# Patient Record
Sex: Male | Born: 1935 | Race: White | Hispanic: No | Smoking: Former smoker
Health system: Southern US, Community
[De-identification: ages and names within clinical notes are randomized; demographics above are authoritative.]

## PROBLEM LIST (undated history)

## (undated) DIAGNOSIS — E119 Type 2 diabetes mellitus without complications: Secondary | ICD-10-CM

## (undated) DIAGNOSIS — G20 Parkinson's disease: Secondary | ICD-10-CM

## (undated) DIAGNOSIS — I10 Essential (primary) hypertension: Secondary | ICD-10-CM

## (undated) HISTORY — PX: CHOLECYSTECTOMY: SHX55

---

## 2004-04-12 ENCOUNTER — Inpatient Hospital Stay: Payer: Self-pay | Admitting: Internal Medicine

## 2005-09-11 ENCOUNTER — Emergency Department: Payer: Self-pay | Admitting: Emergency Medicine

## 2005-12-02 ENCOUNTER — Ambulatory Visit (HOSPITAL_COMMUNITY): Admission: RE | Admit: 2005-12-02 | Discharge: 2005-12-02 | Payer: Self-pay | Admitting: Unknown Physician Specialty

## 2009-03-29 ENCOUNTER — Inpatient Hospital Stay: Payer: Self-pay | Admitting: Internal Medicine

## 2010-04-03 ENCOUNTER — Observation Stay: Payer: Self-pay | Admitting: Internal Medicine

## 2011-12-25 ENCOUNTER — Emergency Department: Payer: Self-pay | Admitting: Emergency Medicine

## 2011-12-25 LAB — BASIC METABOLIC PANEL
Calcium, Total: 8.9 mg/dL (ref 8.5–10.1)
Chloride: 102 mmol/L (ref 98–107)
Co2: 28 mmol/L (ref 21–32)
EGFR (Non-African Amer.): 59 — ABNORMAL LOW
Potassium: 4.2 mmol/L (ref 3.5–5.1)

## 2011-12-25 LAB — CBC
HCT: 40 % (ref 40.0–52.0)
HGB: 13.4 g/dL (ref 13.0–18.0)
MCHC: 33.4 g/dL (ref 32.0–36.0)
WBC: 8.2 10*3/uL (ref 3.8–10.6)

## 2011-12-26 LAB — URINALYSIS, COMPLETE
Bacteria: NONE SEEN
Bilirubin,UR: NEGATIVE
Glucose,UR: 150 mg/dL (ref 0–75)
Ketone: NEGATIVE
Leukocyte Esterase: NEGATIVE
Ph: 5 (ref 4.5–8.0)
RBC,UR: 1 /HPF (ref 0–5)
Squamous Epithelial: NONE SEEN

## 2011-12-26 LAB — HEPATIC FUNCTION PANEL A (ARMC)
Bilirubin, Direct: 0.2 mg/dL (ref 0.00–0.20)
Bilirubin,Total: 0.9 mg/dL (ref 0.2–1.0)

## 2013-01-17 ENCOUNTER — Emergency Department: Payer: Self-pay | Admitting: Emergency Medicine

## 2013-01-17 LAB — CBC WITH DIFFERENTIAL/PLATELET
BASOS PCT: 0.8 %
Basophil #: 0.1 10*3/uL (ref 0.0–0.1)
EOS ABS: 0.1 10*3/uL (ref 0.0–0.7)
EOS PCT: 0.7 %
HCT: 40.5 % (ref 40.0–52.0)
HGB: 13.9 g/dL (ref 13.0–18.0)
LYMPHS ABS: 2.6 10*3/uL (ref 1.0–3.6)
Lymphocyte %: 26 %
MCH: 31.8 pg (ref 26.0–34.0)
MCHC: 34.3 g/dL (ref 32.0–36.0)
MCV: 93 fL (ref 80–100)
MONO ABS: 0.9 x10 3/mm (ref 0.2–1.0)
MONOS PCT: 8.9 %
NEUTROS ABS: 6.3 10*3/uL (ref 1.4–6.5)
Neutrophil %: 63.6 %
PLATELETS: 220 10*3/uL (ref 150–440)
RBC: 4.37 10*6/uL — ABNORMAL LOW (ref 4.40–5.90)
RDW: 13.1 % (ref 11.5–14.5)
WBC: 9.9 10*3/uL (ref 3.8–10.6)

## 2013-01-17 LAB — COMPREHENSIVE METABOLIC PANEL
ALK PHOS: 37 U/L — AB
AST: 18 U/L (ref 15–37)
Albumin: 3.6 g/dL (ref 3.4–5.0)
Anion Gap: 7 (ref 7–16)
BILIRUBIN TOTAL: 1.3 mg/dL — AB (ref 0.2–1.0)
BUN: 25 mg/dL — AB (ref 7–18)
CO2: 26 mmol/L (ref 21–32)
Calcium, Total: 9.5 mg/dL (ref 8.5–10.1)
Chloride: 101 mmol/L (ref 98–107)
Creatinine: 1.35 mg/dL — ABNORMAL HIGH (ref 0.60–1.30)
EGFR (African American): 57 — ABNORMAL LOW
GFR CALC NON AF AMER: 50 — AB
GLUCOSE: 224 mg/dL — AB (ref 65–99)
Osmolality: 280 (ref 275–301)
Potassium: 4.1 mmol/L (ref 3.5–5.1)
SGPT (ALT): 26 U/L (ref 12–78)
SODIUM: 134 mmol/L — AB (ref 136–145)
TOTAL PROTEIN: 6.9 g/dL (ref 6.4–8.2)

## 2013-01-17 LAB — MAGNESIUM: MAGNESIUM: 1.6 mg/dL — AB

## 2013-08-01 ENCOUNTER — Emergency Department: Payer: Self-pay | Admitting: Emergency Medicine

## 2013-08-01 LAB — BASIC METABOLIC PANEL
Anion Gap: 7 (ref 7–16)
BUN: 18 mg/dL (ref 7–18)
CHLORIDE: 102 mmol/L (ref 98–107)
CO2: 27 mmol/L (ref 21–32)
CREATININE: 1.29 mg/dL (ref 0.60–1.30)
Calcium, Total: 9 mg/dL (ref 8.5–10.1)
GFR CALC NON AF AMER: 52 — AB
Glucose: 117 mg/dL — ABNORMAL HIGH (ref 65–99)
OSMOLALITY: 275 (ref 275–301)
Potassium: 4.1 mmol/L (ref 3.5–5.1)
SODIUM: 136 mmol/L (ref 136–145)

## 2013-08-01 LAB — TSH: THYROID STIMULATING HORM: 0.987 u[IU]/mL

## 2013-08-01 LAB — HEPATIC FUNCTION PANEL A (ARMC)
ALBUMIN: 3.7 g/dL (ref 3.4–5.0)
ALT: 29 U/L (ref 12–78)
AST: 28 U/L (ref 15–37)
Alkaline Phosphatase: 36 U/L — ABNORMAL LOW
BILIRUBIN DIRECT: 0.2 mg/dL (ref 0.00–0.20)
Bilirubin,Total: 1.4 mg/dL — ABNORMAL HIGH (ref 0.2–1.0)
Total Protein: 7.5 g/dL (ref 6.4–8.2)

## 2013-08-01 LAB — CBC
HCT: 42.1 % (ref 40.0–52.0)
HGB: 14.3 g/dL (ref 13.0–18.0)
MCH: 31.8 pg (ref 26.0–34.0)
MCHC: 33.9 g/dL (ref 32.0–36.0)
MCV: 94 fL (ref 80–100)
PLATELETS: 222 10*3/uL (ref 150–440)
RBC: 4.48 10*6/uL (ref 4.40–5.90)
RDW: 12.8 % (ref 11.5–14.5)
WBC: 9 10*3/uL (ref 3.8–10.6)

## 2013-08-01 LAB — LIPASE, BLOOD: Lipase: 108 U/L (ref 73–393)

## 2013-08-01 LAB — TROPONIN I

## 2013-08-01 LAB — T4, FREE: Free Thyroxine: 0.94 ng/dL (ref 0.76–1.46)

## 2016-02-15 ENCOUNTER — Emergency Department: Payer: Medicare Other

## 2016-02-15 ENCOUNTER — Encounter: Payer: Self-pay | Admitting: Emergency Medicine

## 2016-02-15 ENCOUNTER — Emergency Department
Admission: EM | Admit: 2016-02-15 | Discharge: 2016-02-15 | Disposition: A | Discharge status: Home / Self Care | Payer: Medicare Other | Attending: Emergency Medicine | Admitting: Emergency Medicine

## 2016-02-15 DIAGNOSIS — Z87891 Personal history of nicotine dependence: Secondary | ICD-10-CM | POA: Insufficient documentation

## 2016-02-15 DIAGNOSIS — K2900 Acute gastritis without bleeding: Secondary | ICD-10-CM

## 2016-02-15 DIAGNOSIS — I10 Essential (primary) hypertension: Secondary | ICD-10-CM | POA: Diagnosis not present

## 2016-02-15 DIAGNOSIS — E119 Type 2 diabetes mellitus without complications: Secondary | ICD-10-CM | POA: Insufficient documentation

## 2016-02-15 DIAGNOSIS — R1013 Epigastric pain: Secondary | ICD-10-CM | POA: Diagnosis present

## 2016-02-15 HISTORY — DX: Type 2 diabetes mellitus without complications: E11.9

## 2016-02-15 HISTORY — DX: Essential (primary) hypertension: I10

## 2016-02-15 LAB — BASIC METABOLIC PANEL
Anion gap: 7 (ref 5–15)
BUN: 22 mg/dL — AB (ref 6–20)
CHLORIDE: 99 mmol/L — AB (ref 101–111)
CO2: 26 mmol/L (ref 22–32)
Calcium: 9.3 mg/dL (ref 8.9–10.3)
Creatinine, Ser: 1.23 mg/dL (ref 0.61–1.24)
GFR, EST NON AFRICAN AMERICAN: 52 mL/min — AB (ref >60)
Glucose, Bld: 111 mg/dL — ABNORMAL HIGH (ref 65–99)
POTASSIUM: 3.7 mmol/L (ref 3.5–5.1)
SODIUM: 132 mmol/L — AB (ref 135–145)

## 2016-02-15 LAB — CBC
HEMATOCRIT: 38.9 % — AB (ref 40.0–52.0)
Hemoglobin: 13.5 g/dL (ref 13.0–18.0)
MCH: 32.5 pg (ref 26.0–34.0)
MCHC: 34.7 g/dL (ref 32.0–36.0)
MCV: 93.7 fL (ref 80.0–100.0)
PLATELETS: 235 10*3/uL (ref 150–440)
RBC: 4.15 MIL/uL — AB (ref 4.40–5.90)
RDW: 13.3 % (ref 11.5–14.5)
WBC: 9.6 10*3/uL (ref 3.8–10.6)

## 2016-02-15 LAB — INFLUENZA PANEL BY PCR (TYPE A & B)
INFLAPCR: NEGATIVE
INFLBPCR: NEGATIVE

## 2016-02-15 LAB — TROPONIN I: Troponin I: <0.03 ng/mL (ref <0.03)

## 2016-02-15 MED ORDER — ONDANSETRON 4 MG PO TBDP
4.0000 mg | ORAL_TABLET | Freq: Once | ORAL | Status: AC | PRN
  Administered 2016-02-15: 4 mg via ORAL
  Filled 2016-02-15: qty 1

## 2016-02-15 MED ORDER — ONDANSETRON HCL 4 MG/2ML IJ SOLN
4.0000 mg | Freq: Once | INTRAMUSCULAR | Status: AC
  Administered 2016-02-15: 4 mg via INTRAVENOUS
  Filled 2016-02-15: qty 2

## 2016-02-15 MED ORDER — ONDANSETRON 4 MG PO TBDP: 4.0000 mg | ORAL_TABLET | Freq: Three times a day (TID) | ORAL | 0 refills | Status: AC | PRN

## 2016-02-15 MED ORDER — SODIUM CHLORIDE 0.9 % IV BOLUS (SEPSIS)
500.0000 mL | Freq: Once | INTRAVENOUS | Status: AC
  Administered 2016-02-15: 500 mL via INTRAVENOUS

## 2016-02-15 NOTE — ED Provider Notes (Signed)
[facility] Emergency Department Provider Note   ____________________________________________    I have reviewed the triage vital signs and the nursing notes.   HISTORY  Chief Complaint Chest Pain     HPI Kirk Obrien is a 83 y.o. male who presents with complaints of nausea and abdominal cramping especially in the epigastrium. He felt like his chest was bothering him at one point now but no longer. He reports he felt well this morning but went to eat breakfast on the way back he started to feel "sick". He states he developed significant nausea from then on, he denies abdominal pain at this time but feels very nauseous. He also feels body aches. No sick contacts reported.   Past Medical History:  Diagnosis Date  . Diabetes mellitus without complication (HCC)   . Hypertension     There are no active problems to display for this patient.   History reviewed. No pertinent surgical history.  Prior to Admission medications   Medication Sig Start Date End Date Taking? Authorizing Provider  ondansetron (ZOFRAN ODT) 4 MG disintegrating tablet Take 1 tablet (4 mg total) by mouth every 8 (eight) hours as needed for nausea or vomiting. 02/15/16   Jene Every, MD     Allergies Patient has no known allergies.  No family history on file.  Social History Social History  Substance Use Topics  . Smoking status: Former Games developer  . Smokeless tobacco: Not on file  . Alcohol use Not on file    Review of Systems  Constitutional: No fever/chills Eyes: No visual changes.  ENT: No sore throat. Cardiovascular: As above Respiratory: Denies shortness of breath. Gastrointestinal: Nausea as above Genitourinary: Negative for dysuria. Musculoskeletal: Negative for back pain. Skin: Negative for rash. Neurological: Negative for headaches   10-point ROS otherwise negative.  ____________________________________________   PHYSICAL EXAM:  VITAL  SIGNS: ED Triage Vitals [02/15/16 0958]  Enc Vitals Group     BP (!) 151/59     Pulse Rate (!) 59     Resp 20     Temp 97.9 F (36.6 C)     Temp Source Oral     SpO2 98 %     Weight 194 lb (88 kg)     Height 6\' 1"  (1.854 m)     Head Circumference      Peak Flow      Pain Score      Pain Loc      Pain Edu?      Excl. in GC?     Constitutional: Alert and oriented. No acute distress. Pleasant and interactive Eyes: Conjunctivae are normal.   Nose: No congestion/rhinnorhea. Mouth/Throat: Mucous membranes are moist.    Cardiovascular: Normal rate, regular rhythm. Grossly normal heart sounds.  Good peripheral circulation. Respiratory: Normal respiratory effort.  No retractions. Lungs CTAB. Gastrointestinal: Soft and nontender. No distention.  No CVA tenderness. Genitourinary: deferred Musculoskeletal: No lower extremity tenderness nor edema.  Warm and well perfused Neurologic:  Normal speech and language. No gross focal neurologic deficits are appreciated.  Skin:  Skin is warm, dry and intact. No rash noted. Psychiatric: Mood and affect are normal. Speech and behavior are normal.  ____________________________________________   LABS (all labs ordered are listed, but only abnormal results are displayed)  Labs Reviewed  BASIC METABOLIC PANEL - Abnormal; Notable for the following:       Result Value   Sodium 132 (*)    Chloride 99 (*)  Glucose, Bld 111 (*)    BUN 22 (*)    GFR calc non Af Amer 52 (*)    All other components within normal limits  CBC - Abnormal; Notable for the following:    RBC 4.15 (*)    HCT 38.9 (*)    All other components within normal limits  TROPONIN I  TROPONIN I  INFLUENZA PANEL BY PCR (TYPE A & B)   ____________________________________________  EKG  ED ECG REPORT I, Jene EveryKINNER, Kimball, the attending physician, personally viewed and interpreted this ECG.  Date: 02/15/2016 EKG Time: 9:57 AM Rate: 59 Rhythm: Sinus bradycardia QRS Axis:  normal Intervals: normal ST/T Wave abnormalities: normal Conduction Disturbances: none Narrative Interpretation: unremarkable  ____________________________________________  RADIOLOGY  Chest x-ray unremarkable ____________________________________________   PROCEDURES  Procedure(s) performed: No    Critical Care performed: No ____________________________________________   INITIAL IMPRESSION / ASSESSMENT AND PLAN / ED COURSE  Pertinent labs & imaging results that were available during my care of the patient were reviewed by me and considered in my medical decision making (see chart for details).  Patient presented with complaints of primarily significant nausea. He also notes body aches. Denies chest pain to me at this time. No fevers or chills reported. Overall symptoms are somewhat vague. We will give IV fluids check an influenza and reevaluate    _----------------------------------------- 3:06 PM on 02/15/2016 -----------------------------------------  Patient feels significantly better after nausea medication. Second troponin is normal. Influenza is negative. He is sitting on the edge of the bed and anxious to go home. I feel this is reasonable. This may be related to gastritis secondary to his breakfast. He agrees with discharge and will return if any worsening of his symptoms. ___________________________________________   FINAL CLINICAL IMPRESSION(S) / ED DIAGNOSES  Final diagnoses:  Acute gastritis without hemorrhage, unspecified gastritis type      NEW MEDICATIONS STARTED DURING THIS VISIT:  New Prescriptions   ONDANSETRON (ZOFRAN ODT) 4 MG DISINTEGRATING TABLET    Take 1 tablet (4 mg total) by mouth every 8 (eight) hours as needed for nausea or vomiting.     Note:  This document was prepared using Dragon voice recognition software and may include unintentional dictation errors.    Jene Everyobert Keilan Nichol, MD 02/15/16 [phone]

## 2016-02-15 NOTE — ED Notes (Signed)
Pt states he is feeling better since last dose of zofran. Will continue to monitor the pt.

## 2016-02-15 NOTE — ED Triage Notes (Signed)
States chest pain, SOB, nausea and dizziness began 30 min prior to arrival.

## 2017-09-30 ENCOUNTER — Emergency Department: Payer: Medicare Other

## 2017-09-30 ENCOUNTER — Other Ambulatory Visit: Payer: Self-pay

## 2017-09-30 ENCOUNTER — Emergency Department
Admission: EM | Admit: 2017-09-30 | Discharge: 2017-09-30 | Disposition: A | Discharge status: Home / Self Care | Payer: Medicare Other | Attending: Emergency Medicine | Admitting: Emergency Medicine

## 2017-09-30 DIAGNOSIS — Z87891 Personal history of nicotine dependence: Secondary | ICD-10-CM | POA: Diagnosis not present

## 2017-09-30 DIAGNOSIS — R0602 Shortness of breath: Secondary | ICD-10-CM | POA: Insufficient documentation

## 2017-09-30 DIAGNOSIS — E119 Type 2 diabetes mellitus without complications: Secondary | ICD-10-CM | POA: Diagnosis not present

## 2017-09-30 DIAGNOSIS — G20 Parkinson's disease: Secondary | ICD-10-CM | POA: Insufficient documentation

## 2017-09-30 DIAGNOSIS — K2970 Gastritis, unspecified, without bleeding: Secondary | ICD-10-CM | POA: Insufficient documentation

## 2017-09-30 DIAGNOSIS — I10 Essential (primary) hypertension: Secondary | ICD-10-CM | POA: Diagnosis not present

## 2017-09-30 HISTORY — DX: Parkinson's disease: G20

## 2017-09-30 LAB — BASIC METABOLIC PANEL
ANION GAP: 10 (ref 5–15)
BUN: 21 mg/dL (ref 8–23)
CALCIUM: 9 mg/dL (ref 8.9–10.3)
CO2: 29 mmol/L (ref 22–32)
Chloride: 99 mmol/L (ref 98–111)
Creatinine, Ser: 1.01 mg/dL (ref 0.61–1.24)
GLUCOSE: 187 mg/dL — AB (ref 70–99)
Potassium: 3.8 mmol/L (ref 3.5–5.1)
SODIUM: 138 mmol/L (ref 135–145)

## 2017-09-30 LAB — CBC
HCT: 36.5 % — ABNORMAL LOW (ref 40.0–52.0)
HEMOGLOBIN: 12.5 g/dL — AB (ref 13.0–18.0)
MCH: 32.1 pg (ref 26.0–34.0)
MCHC: 34.3 g/dL (ref 32.0–36.0)
MCV: 93.7 fL (ref 80.0–100.0)
PLATELETS: 208 10*3/uL (ref 150–440)
RBC: 3.89 MIL/uL — ABNORMAL LOW (ref 4.40–5.90)
RDW: 14.3 % (ref 11.5–14.5)
WBC: 6.3 10*3/uL (ref 3.8–10.6)

## 2017-09-30 LAB — TROPONIN I

## 2017-09-30 MED ORDER — GI COCKTAIL ~~LOC~~
30.0000 mL | Freq: Once | ORAL | Status: AC
  Administered 2017-09-30: 30 mL via ORAL
  Filled 2017-09-30: qty 30

## 2017-09-30 NOTE — ED Notes (Signed)
Pt sitting on edge of bed drinking water.  Continues to say he is choking.  Reassurance provided.

## 2017-09-30 NOTE — ED Notes (Signed)
Called to pt room.  Pt states he is choking.  Water given to pt.  He swallowed the water without difficulty (1/2 cup).  Continued to state he is choking.  Ask for MD.  MD consulted and pt instructed (as MD said) that the GI cocktail may have numbed his throat some and that is what he is feeling.  Pt sitting on edge of bed.  Appears anxious.  Reassurance provided.

## 2017-09-30 NOTE — ED Notes (Signed)
Pt sitting on bedside.  Appears anxious.  Reassurance provided.

## 2017-09-30 NOTE — ED Triage Notes (Signed)
Pt c/o sob that started approx 45 minutes ago while sitting down. Pt does not appear to be in any respiratory distress. Talks evenly, and without difficulty. Pt alert and oriented X4, active, cooperative, pt in NAD. RR even and unlabored, color WNL. '

## 2017-09-30 NOTE — ED Provider Notes (Signed)
[facility] Emergency Department Provider Note ___________________________________________   First MD Initiated Contact with Patient 09/30/17 1603     (approximate)  I have reviewed the triage vital signs and the nursing notes.   HISTORY  Chief Complaint Shortness of Breath  HPI Kirk Obrien is a 84 y.o. male a history of Parkinson's disease, diabetes hypertension anxiety was presented to the emergency department with shortness of breath that started approximately 2 PM.  The patient says that he woke from a nap feeling very short of breath and the sensation lasted about 45 minutes.  He took 1 baby aspirin now says that he has burning to his epigastrium which is mild and nonradiating.  However, denies any persistence of the shortness of breath.  Denies any chest pain.  Denies any nausea or vomiting.  Says that he has had this shortness of breath in the past associated with anxiety.  However, the burning in the upper abdomen is new.  Past Medical History:  Diagnosis Date  . Diabetes mellitus without complication (HCC)   . Hypertension   . Parkinson's disease (HCC)     There are no active problems to display for this patient.   History reviewed. No pertinent surgical history.  Prior to Admission medications   Medication Sig Start Date End Date Taking? Authorizing Provider  ondansetron (ZOFRAN ODT) 4 MG disintegrating tablet Take 1 tablet (4 mg total) by mouth every 8 (eight) hours as needed for nausea or vomiting. 02/15/16   Jene Every, MD    Allergies Patient has no known allergies.  No family history on file.  Social History Social History   Tobacco Use  . Smoking status: Former Smoker  Substance Use Topics  . Alcohol use: Not on file  . Drug use: Not on file    Review of Systems  Constitutional: No fever/chills Eyes: No visual changes. ENT: No sore throat. Cardiovascular: Denies chest pain. Respiratory: As  above Gastrointestinal: No nausea, no vomiting.  No diarrhea.  No constipation. Genitourinary: Negative for dysuria. Musculoskeletal: Negative for back pain. Skin: Negative for rash. Neurological: Negative for headaches, focal weakness or numbness.   ____________________________________________   PHYSICAL EXAM:  VITAL SIGNS: ED Triage Vitals [09/30/17 1442]  Enc Vitals Group     BP (!) 164/64     Pulse Rate (!) 56     Resp 16     Temp 97.7 F (36.5 C)     Temp Source Oral     SpO2 99 %     Weight 180 lb (81.6 kg)     Height 6\' 2"  (1.88 m)     Head Circumference      Peak Flow      Pain Score 0     Pain Loc      Pain Edu?      Excl. in GC?     Constitutional: Alert and oriented. Well appearing and in no acute distress. Eyes: Conjunctivae are normal.  Head: Atraumatic. Nose: No congestion/rhinnorhea. Mouth/Throat: Mucous membranes are moist.  Neck: No stridor.   Cardiovascular: Normal rate, regular rhythm. Grossly normal heart sounds.  Respiratory: Normal respiratory effort.  No retractions. Lungs CTAB. Gastrointestinal: Soft with very mild epigastric tenderness to palpation. No distention.  Musculoskeletal: Patient with moderate bilateral lower extremity edema which she says is unchanged from previous.  No joint effusions. Neurologic:  Normal speech and language. No gross focal neurologic deficits are appreciated. Skin:  Skin is warm, dry and intact. No rash  noted. Psychiatric: Mood and affect are normal. Speech and behavior are normal.  ____________________________________________   LABS (all labs ordered are listed, but only abnormal results are displayed)  Labs Reviewed  BASIC METABOLIC PANEL - Abnormal; Notable for the following components:      Result Value   Glucose, Bld 187 (*)    All other components within normal limits  CBC - Abnormal; Notable for the following components:   RBC 3.89 (*)    Hemoglobin 12.5 (*)    HCT 36.5 (*)    All other  components within normal limits  TROPONIN I  TROPONIN I   ____________________________________________  EKG  ED ECG REPORT I, Arelia Longestavid M Trenity Pha, the attending physician, personally viewed and interpreted this ECG.   Date: 09/30/2017  EKG Time: 1438  Rate: 58  Rhythm: normal sinus rhythm  Axis: Normal  Intervals:none  ST&T Change: No ST segment elevation or depression.  No abnormal T wave inversion.  ____________________________________________  RADIOLOGY  Chest x-ray without any acute pathology. ____________________________________________   PROCEDURES  Procedure(s) performed:   Procedures  Critical Care performed:   ____________________________________________   INITIAL IMPRESSION / ASSESSMENT AND PLAN / ED COURSE  Pertinent labs & imaging results that were available during my care of the patient were reviewed by me and considered in my medical decision making (see chart for details).  Differential includes, but is not limited to, viral syndrome, bronchitis including COPD exacerbation, pneumonia, reactive airway disease including asthma, CHF including exacerbation with or without pulmonary/interstitial edema, pneumothorax, ACS, thoracic trauma, and pulmonary embolism. Differential diagnosis includes, but is not limited to, biliary disease (biliary colic, acute cholecystitis, cholangitis, choledocholithiasis, etc), intrathoracic causes for epigastric abdominal pain including ACS, gastritis, duodenitis, pancreatitis, small bowel or large bowel obstruction, abdominal aortic aneurysm, hernia, and ulcer(s). As part of my medical decision making, I reviewed the following data within the electronic MEDICAL RECORD NUMBER Notes from prior ED visits  ----------------------------------------- 6:32 PM on 09/30/2017 -----------------------------------------  Patient at this time is symptom-free after GI cocktail.  Initially he thought that he was choking but this was just the GI  cocktail numbing his throat.  He now has no abdominal pain.  Also seems to have a history of gastritis from previous ER records.  Second troponin negative.  Will be discharged at this time.  To follow-up as an outpatient.  He is understanding the diagnosis as well as treatment plan willing to comply. ____________________________________________   FINAL CLINICAL IMPRESSION(S) / ED DIAGNOSES  Shortness of breath.  Gastritis.  NEW MEDICATIONS STARTED DURING THIS VISIT:  New Prescriptions   No medications on file     Note:  This document was prepared using Dragon voice recognition software and may include unintentional dictation errors.     Myrna BlazerSchaevitz, Sugey Trevathan Matthew, MD 09/30/17 [id number]

## 2017-11-06 ENCOUNTER — Ambulatory Visit: Payer: Medicare Other | Admitting: Cardiology

## 2017-11-13 NOTE — Progress Notes (Signed)
Cardiology Office Note:    Date:  11/14/2017   ID:  Kirk Obrien, DOB 1931-11-13, MRN [id number]  PCP:  Sharalyn Ink, MD  Cardiologist:  Jodelle Red, MD PhD  Referring MD: Sharalyn Ink, MD   CC: shortness of breath, LE edema.   History of Present Illness:    Kirk Obrien is a 84 y.o. male with a hx of hypertension, h pylori s/p treatment, type II diabetes who is seen as a new consult at the request of Rutherford, Tobi Bastos, MD for the evaluation and management of shortness of breath and LE edema.  Patient (and son) concerns today: no urgent concerns today, feeling well overall. Had shortness of breath at [facility] with leg edema. No prior history of heart issues.   He reports that he had swelling for 2-3 weeks but had shortness of breath for 3-4 hours. It woke him up from a nap. Went to [facility] for evaluation. He denies chest pain to me. Had one prior episode of SOB when he went to [facility] ER about 3 weeks prior to [name] visit. At the ER on 09/30/17, he was given lasix (not clear in the chart of the dose), initially taken every day and now just takes if he gains 3 lbs.   Sees Dr. Eustace Quail at the [facility]. He saw her yesterday, was told he doesn't have Parkinson's, but has some unknown reason for shuffling gait.   Several years ago was told he had a leaky valve, was checked again and told it was fine. Had those echoes several years ago at the [place]. Has not had any echoes showing failure, but that is the concern now.  When he was the most swollen, weight was in the 170s but very swollen. Now he is trying to gain weight, up to 181, with ensure.  Weighs himself and takes blood pressures daily. Now takes furosemide about 1/week, though in the last three days he has taken lasix twice for cough/shortness of breath.   Does physical therapy twice a week, no chest pain or shortness of breath with that. No longer lies flat, sleeps in recliner (reclines nearly flat). No PND. LE  edema much improved. Has had 7 mechanical falls (last >1 month ago), no syncope or palpitations.   Past Medical History:  Diagnosis Date  . Diabetes mellitus without complication (HCC)   . Hypertension   . Parkinson's disease (HCC)     History reviewed. No pertinent surgical history.  Current Medications: Current Outpatient Medications on File Prior to Visit  Medication Sig  . chlorthalidone (HYGROTON) 50 MG tablet Take by mouth.  . coal tar-salicylic acid 2 % shampoo Apply topically.  . docusate sodium (COLACE) 100 MG capsule Take by mouth.  . fluocinolone (SYNALAR) 0.01 % external solution Apply topically.  . fluoruracil (CARAC) 0.5 % cream Apply topically.  . fluticasone (FLONASE) 50 MCG/ACT nasal spray Place into the nose.  Marland Kitchen glipiZIDE (GLUCOTROL) 5 MG tablet Take by mouth.  . hydrALAZINE (APRESOLINE) 25 MG tablet Take by mouth.  Marland Kitchen lisinopril (PRINIVIL,ZESTRIL) 40 MG tablet Take by mouth.  . metFORMIN (GLUCOPHAGE) 500 MG tablet Take by mouth.  . metoprolol succinate (TOPROL-XL) 50 MG 24 hr tablet Take by mouth.  Marland Kitchen omeprazole (PRILOSEC) 20 MG capsule Take by mouth.  . ondansetron (ZOFRAN ODT) 4 MG disintegrating tablet Take 1 tablet (4 mg total) by mouth every 8 (eight) hours as needed for nausea or vomiting.  Marland Kitchen rOPINIRole (REQUIP) 1 MG tablet Take by mouth.  Marland Kitchen  temazepam (RESTORIL) 15 MG capsule Take by mouth.  . traZODone (DESYREL) 100 MG tablet Take by mouth.   No current facility-administered medications on file prior to visit.      Allergies:   Patient has no known allergies.   Social History   Socioeconomic History  . Marital status: Divorced    Spouse name: Not on file  . Number of children: Not on file  . Years of education: Not on file  . Highest education level: Not on file  Occupational History  . Not on file  Social Needs  . Financial resource strain: Not on file  . Food insecurity:    Worry: Not on file    Inability: Not on file  . Transportation  needs:    Medical: Not on file    Non-medical: Not on file  Tobacco Use  . Smoking status: Former Games developer  . Smokeless tobacco: Never Used  Substance and Sexual Activity  . Alcohol use: Not on file  . Drug use: Not on file  . Sexual activity: Not on file  Lifestyle  . Physical activity:    Days per week: Not on file    Minutes per session: Not on file  . Stress: Not on file  Relationships  . Social connections:    Talks on phone: Not on file    Gets together: Not on file    Attends religious service: Not on file    Active member of club or organization: Not on file    Attends meetings of clubs or organizations: Not on file    Relationship status: Not on file  Other Topics Concern  . Not on file  Social History Narrative  . Not on file     Family History: The patient's family history is notable for uncles (maternal side) with multiple Mis. Father's side was alcoholism. One brother (late 51s) and one sister (in her 32s) died of cancer.  ROS:   Please see the history of present illness.  Additional pertinent ROS:  Constitutional: Negative for chills, fever, night sweats, unintentional weight loss  HENT: Negative for ear pain. Positive for chronic hearing loss.   Eyes: Negative for loss of vision and eye pain.  Respiratory: Positive for resolved shortness of breath. Negative for cough, sputum, wheezing recently.   Cardiovascular: Positive for possible orthopnea, LE edema. Negative for chest pain, palpitations , PND, and claudication.  Gastrointestinal: Negative for abdominal pain, melena, and hematochezia.  Genitourinary: Negative for dysuria and hematuria.  Musculoskeletal: Negative for falls and myalgias.  Skin: Negative for itching and rash. Undergoing treatment for skin cancer on left arm.  Neurological: Negative for focal weakness, focal sensory changes and loss of consciousness.  Frequent falls, shuffling gait Endo/Heme/Allergies: Does not bruise/bleed easily.     EKGs/Labs/Other Studies Reviewed:    The following studies were reviewed today: None available  EKG:  EKG is ordered today.  The ekg ordered today demonstrates sinus rhythm, 1st degree AV block, Q in III/aVF. PVC.  Recent Labs: 09/30/2017: BUN 21; Creatinine, Ser 1.01; Hemoglobin 12.5; Platelets 208; Potassium 3.8; Sodium 138  Recent Lipid Panel No results found for: CHOL, TRIG, HDL, CHOLHDL, VLDL, LDLCALC, LDLDIRECT  Physical Exam:    VS:  BP 132/62   Pulse 61   Ht 6\' 2"  (1.88 m)   Wt 180 lb 12.8 oz (82 kg)   BMI 23.21 kg/m     Wt Readings from Last 3 Encounters:  11/14/17 180 lb 12.8 oz (82 kg)  09/30/17 180 lb (81.6 kg)  02/15/16 194 lb (88 kg)     GEN: Well nourished, well developed in no acute distress HEENT: Normal NECK: JVD 8 cm, no HJR; No carotid bruits LYMPHATICS: No lymphadenopathy CARDIAC: regular rhythm, normal S1 and S2, no  rubs, gallops. 1/6 SEM early peaking at RUSB. Radial and DP pulses 2+ bilaterally. RESPIRATORY:  Clear to auscultation without rales, wheezing or rhonchi  ABDOMEN: Soft, non-tender, non-distended MUSCULOSKELETAL:  Trace RLE edema, 1+ LLE edema without warmth or erythema (reports L always more than R), negative Homan's; No deformity  SKIN: Warm and dry NEUROLOGIC:  Alert and oriented x 3 PSYCHIATRIC:  Normal affect   ASSESSMENT:    1. SOB (shortness of breath)   2. Bilateral leg edema   3. Essential hypertension    PLAN:    1. Shortness of breath (resolved except for orthopnea) and LE edema (improving): concern for cardiac etiology. Nearly euvolemic today, taking furosemide PRN. No PND, no DOE, no chest pain. Quiet SEM. Lungs clear.  -echocardiogram ordered  -plans to follow up with [facility] after echo completed   2. Hypertension: at goal today -on chlorthalidone, hydralazine, lisinopril, metoprolol succinate per the [place].   Plan for follow up: return to [facility] for further evaluation. We will contact him with results of the  echo.  Medication Adjustments/Labs and Tests Ordered: Current medicines are reviewed at length with the patient today.  Concerns regarding medicines are outlined above.  Orders Placed This Encounter  Procedures  . EKG 12-Lead  . ECHOCARDIOGRAM COMPLETE   No orders of the defined types were placed in this encounter.   Patient Instructions  Medication Instructions:  Your Physician recommend you continue on your current medication as directed.       If you need a refill on your cardiac medications before your next appointment, please call your pharmacy.   Lab work: None   Testing/Procedures: Your physician has requested that you have an echocardiogram. Echocardiography is a painless test that uses sound waves to create images of your heart. It provides your doctor with information about the size and shape of your heart and how well your heart's chambers and valves are working. This procedure takes approximately one hour. There are no restrictions for this procedure. [address]     Follow-Up: Your physician recommends that you schedule a follow-up appointment with [facility]   Any Other Special Instructions Will Be Listed Below (If Applicable).       Signed, Jodelle Red, MD PhD 11/14/2017 9:10 AM    [facility]

## 2017-11-14 ENCOUNTER — Ambulatory Visit (INDEPENDENT_AMBULATORY_CARE_PROVIDER_SITE_OTHER): Payer: Medicare Other | Admitting: Cardiology

## 2017-11-14 ENCOUNTER — Encounter: Payer: Self-pay | Admitting: Cardiology

## 2017-11-14 VITALS — BP 132/62 | HR 61 | Ht 74.0 in | Wt 180.8 lb

## 2017-11-14 DIAGNOSIS — I10 Essential (primary) hypertension: Secondary | ICD-10-CM | POA: Diagnosis not present

## 2017-11-14 DIAGNOSIS — R600 Localized edema: Secondary | ICD-10-CM

## 2017-11-14 DIAGNOSIS — R0602 Shortness of breath: Secondary | ICD-10-CM | POA: Diagnosis not present

## 2017-11-14 NOTE — Patient Instructions (Signed)
Medication Instructions:  Your Physician recommend you continue on your current medication as directed.       If you need a refill on your cardiac medications before your next appointment, please call your pharmacy.   Lab work: None   Testing/Procedures: Your physician has requested that you have an echocardiogram. Echocardiography is a painless test that uses sound waves to create images of your heart. It provides your doctor with information about the size and shape of your heart and how well your heart's chambers and valves are working. This procedure takes approximately one hour. There are no restrictions for this procedure. [address]     Follow-Up: Your physician recommends that you schedule a follow-up appointment with [facility]   Any Other Special Instructions Will Be Listed Below (If Applicable).

## 2017-11-15 ENCOUNTER — Ambulatory Visit (INDEPENDENT_AMBULATORY_CARE_PROVIDER_SITE_OTHER): Payer: Medicare Other

## 2017-11-15 ENCOUNTER — Other Ambulatory Visit: Payer: Self-pay

## 2017-11-15 DIAGNOSIS — R0602 Shortness of breath: Secondary | ICD-10-CM | POA: Diagnosis not present

## 2018-07-27 ENCOUNTER — Other Ambulatory Visit: Payer: Self-pay

## 2018-07-27 ENCOUNTER — Emergency Department (HOSPITAL_COMMUNITY)
Admission: EM | Admit: 2018-07-27 | Discharge: 2018-07-27 | Disposition: A | Discharge status: Home / Self Care | Payer: No Typology Code available for payment source | Attending: Emergency Medicine | Admitting: Emergency Medicine

## 2018-07-27 ENCOUNTER — Emergency Department (HOSPITAL_COMMUNITY): Payer: No Typology Code available for payment source

## 2018-07-27 ENCOUNTER — Encounter (HOSPITAL_COMMUNITY): Payer: Self-pay

## 2018-07-27 DIAGNOSIS — G20 Parkinson's disease: Secondary | ICD-10-CM | POA: Diagnosis not present

## 2018-07-27 DIAGNOSIS — Z79899 Other long term (current) drug therapy: Secondary | ICD-10-CM | POA: Diagnosis not present

## 2018-07-27 DIAGNOSIS — M542 Cervicalgia: Secondary | ICD-10-CM | POA: Diagnosis not present

## 2018-07-27 DIAGNOSIS — I10 Essential (primary) hypertension: Secondary | ICD-10-CM | POA: Diagnosis not present

## 2018-07-27 DIAGNOSIS — M545 Low back pain, unspecified: Secondary | ICD-10-CM

## 2018-07-27 DIAGNOSIS — E119 Type 2 diabetes mellitus without complications: Secondary | ICD-10-CM | POA: Diagnosis not present

## 2018-07-27 DIAGNOSIS — Z7984 Long term (current) use of oral hypoglycemic drugs: Secondary | ICD-10-CM | POA: Diagnosis not present

## 2018-07-27 DIAGNOSIS — Z87891 Personal history of nicotine dependence: Secondary | ICD-10-CM | POA: Diagnosis not present

## 2018-07-27 MED ORDER — HYDROCODONE-ACETAMINOPHEN 5-325 MG PO TABS: ORAL_TABLET | ORAL | 0 refills | Status: AC

## 2018-07-27 MED ORDER — HYDROCODONE-ACETAMINOPHEN 5-325 MG PO TABS
1.0000 | ORAL_TABLET | Freq: Once | ORAL | Status: AC
  Administered 2018-07-27: 1 via ORAL
  Filled 2018-07-27: qty 1

## 2018-07-27 NOTE — ED Notes (Signed)
Patient transported to X-ray 

## 2018-07-27 NOTE — ED Notes (Signed)
Call to contact   Will come to get pt

## 2018-07-27 NOTE — ED Notes (Signed)
Patient ambulated to the restroom back to room. Tolerated well.

## 2018-07-27 NOTE — ED Notes (Signed)
Call to [facility] [phone]

## 2018-07-27 NOTE — Discharge Instructions (Signed)
Take the prescription as directed.  Apply moist heat or ice to the area(s) of discomfort, for 15 minutes at a time, several times per day for the next few days.  Do not fall asleep on a heating or ice pack.  Call your regular medical doctor today to schedule a follow up appointment in the next 3 days.  Return to the Emergency Department immediately if worsening.
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
## 2018-07-27 NOTE — ED Notes (Signed)
Pt to desk   Explained we are awaiting his DC instruction
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
## 2020-05-17 DEATH — deceased
